# Patient Record
Sex: Male | Born: 1955 | Race: Black or African American | Hispanic: No | Marital: Married | Smoking: Never smoker
Health system: Southern US, Community
[De-identification: ages and names within clinical notes are randomized; demographics above are authoritative.]

---

## 1999-02-07 ENCOUNTER — Emergency Department (HOSPITAL_COMMUNITY): Admission: EM | Admit: 1999-02-07 | Discharge: 1999-02-07 | Payer: Self-pay | Admitting: Emergency Medicine

## 1999-02-07 ENCOUNTER — Encounter: Payer: Self-pay | Admitting: Emergency Medicine

## 2003-08-10 ENCOUNTER — Emergency Department (HOSPITAL_COMMUNITY): Admission: EM | Admit: 2003-08-10 | Discharge: 2003-08-11 | Payer: Self-pay | Admitting: Emergency Medicine

## 2011-05-09 ENCOUNTER — Ambulatory Visit (INDEPENDENT_AMBULATORY_CARE_PROVIDER_SITE_OTHER): Payer: 59 | Admitting: Emergency Medicine

## 2011-05-09 VITALS — BP 157/82 | HR 50 | Temp 98.0°F | Resp 16 | Ht 70.0 in | Wt 222.4 lb

## 2011-05-09 DIAGNOSIS — J069 Acute upper respiratory infection, unspecified: Secondary | ICD-10-CM

## 2011-05-09 DIAGNOSIS — R059 Cough, unspecified: Secondary | ICD-10-CM

## 2011-05-09 DIAGNOSIS — J209 Acute bronchitis, unspecified: Secondary | ICD-10-CM

## 2011-05-09 DIAGNOSIS — R05 Cough: Secondary | ICD-10-CM

## 2011-05-09 MED ORDER — FLUTICASONE PROPIONATE 50 MCG/ACT NA SUSP: 2.0000 | Freq: Every day | NASAL | Status: DC

## 2011-05-09 MED ORDER — AZITHROMYCIN 250 MG PO TABS: ORAL_TABLET | ORAL | Status: AC

## 2011-05-09 MED ORDER — BENZONATATE 200 MG PO CAPS: 200.0000 mg | ORAL_CAPSULE | Freq: Three times a day (TID) | ORAL | Status: AC | PRN

## 2011-05-10 NOTE — Progress Notes (Signed)
  Subjective:    Patient ID: Guy Huff, male    DOB: 09-30-1955, 56 y.o.   MRN: [id number]  HPI Patient enters with head congestion sore throat and a productive cough. He is a significant amount of nasal congestion and may have some allergies.    Review of Systems noncontributory except as relates to the present illness.     Objective:   Physical Exam HEENT exam reveals nasal congestion throat is slightly red TMs are clear chest exam reveals rhonchi but no rales no dullness clear breath sounds otherwise the        Assessment & Plan:  Assessment is probable upper respiratory infection with possible underlying allergic rhinitis with secondary bronchitis

## 2011-09-13 ENCOUNTER — Ambulatory Visit: Payer: 59

## 2011-09-13 ENCOUNTER — Ambulatory Visit (INDEPENDENT_AMBULATORY_CARE_PROVIDER_SITE_OTHER): Payer: 59 | Admitting: Internal Medicine

## 2011-09-13 VITALS — BP 128/78 | HR 56 | Temp 98.4°F | Resp 16 | Ht 69.0 in | Wt 225.0 lb

## 2011-09-13 DIAGNOSIS — S8990XA Unspecified injury of unspecified lower leg, initial encounter: Secondary | ICD-10-CM

## 2011-09-13 DIAGNOSIS — S99921A Unspecified injury of right foot, initial encounter: Secondary | ICD-10-CM

## 2011-09-13 DIAGNOSIS — IMO0001 Reserved for inherently not codable concepts without codable children: Secondary | ICD-10-CM

## 2011-09-13 DIAGNOSIS — S91309A Unspecified open wound, unspecified foot, initial encounter: Secondary | ICD-10-CM

## 2011-09-13 DIAGNOSIS — S99929A Unspecified injury of unspecified foot, initial encounter: Secondary | ICD-10-CM

## 2011-09-13 DIAGNOSIS — S91339A Puncture wound without foreign body, unspecified foot, initial encounter: Secondary | ICD-10-CM

## 2011-09-13 LAB — POCT CBC
Granulocyte percent: 47.1 %G (ref 37–80)
HCT, POC: 43.2 % — AB (ref 43.5–53.7)
Hemoglobin: 13.3 g/dL — AB (ref 14.1–18.1)
Lymph, poc: 2.7 (ref 0.6–3.4)
MCH, POC: 27.9 pg (ref 27–31.2)
MCHC: 30.8 g/dL — AB (ref 31.8–35.4)
MCV: 90.5 fL (ref 80–97)
MID (cbc): 0.6 (ref 0–0.9)
MPV: 9.1 fL (ref 0–99.8)
POC Granulocyte: 2.9 (ref 2–6.9)
POC LYMPH PERCENT: 43.7 %L (ref 10–50)
POC MID %: 9.2 %M (ref 0–12)
Platelet Count, POC: 209 10*3/uL (ref 142–424)
RBC: 4.77 M/uL (ref 4.69–6.13)
RDW, POC: 16.5 %
WBC: 6.2 10*3/uL (ref 4.6–10.2)

## 2011-09-13 MED ORDER — SULFAMETHOXAZOLE-TRIMETHOPRIM 800-160 MG PO TABS: 1.0000 | ORAL_TABLET | Freq: Two times a day (BID) | ORAL | Status: AC

## 2011-09-13 MED ORDER — TRAMADOL HCL 50 MG PO TABS: 50.0000 mg | ORAL_TABLET | Freq: Three times a day (TID) | ORAL | Status: AC | PRN

## 2011-09-13 NOTE — Progress Notes (Signed)
  Subjective:    Patient ID: Guy Huff, male    DOB: [birth year], 56 y.o.   MRN: [id number]  HPI Pain right foot Sole of foot  onset yesteday  stepped on a nail while working on tearing apart an old deck Nail rusty went through his old work boot  pain 5/10 increase with walking no drainage no radiation of the pain No diabetes does not know when his last tetanus shot was. Works as a IT sales professional  Review of Systems  Constitutional: Negative.   Eyes: Negative.   Respiratory: Negative.   Cardiovascular: Negative.   Gastrointestinal: Negative.   Genitourinary: Negative.   Musculoskeletal: Positive for gait problem.       Right foot pain  Skin: Positive for wound.       Planter surface of hte right foot  Neurological: Negative.   Hematological: Negative.   Psychiatric/Behavioral: Negative.   All other systems reviewed and are negative.       Objective:   Physical Exam  Constitutional: He is oriented to person, place, and time. He appears well-developed and well-nourished.  HENT:  Head: Normocephalic and atraumatic.  Right Ear: External ear normal.  Left Ear: External ear normal.  Nose: Nose normal.  Mouth/Throat: Oropharynx is clear and moist.  Eyes: Conjunctivae and EOM are normal. Pupils are equal, round, and reactive to light.  Neck: Normal range of motion. Neck supple.  Cardiovascular: Normal rate, regular rhythm and normal heart sounds.   Pulmonary/Chest: Effort normal and breath sounds normal.  Abdominal: Soft. Bowel sounds are normal.  Musculoskeletal: He exhibits tenderness.       Planter s urface of the right foot  Neurological: He is alert and oriented to person, place, and time. He has normal reflexes.  Skin: There is erythema.       plantrer surface of hte right foot  Psychiatric: He has a normal mood and affect. His behavior is normal. Judgment and thought content normal.     Results for orders placed in visit on 09/13/11  POCT CBC      Component Value  Range   WBC 6.2  4.6 - 10.2 K/uL   Lymph, poc 2.7  0.6 - 3.4   POC LYMPH PERCENT 43.7  10 - 50 %L   MID (cbc) 0.6  0 - 0.9   POC MID % 9.2  0 - 12 %M   POC Granulocyte 2.9  2 - 6.9   Granulocyte percent 47.1  37 - 80 %G   RBC 4.77  4.69 - 6.13 M/uL   Hemoglobin 13.3 (*) 14.1 - 18.1 g/dL   HCT, POC 62.8 (*) 31.5 - 53.7 %   MCV 90.5  80 - 97 fL   MCH, POC 27.9  27 - 31.2 pg   MCHC 30.8 (*) 31.8 - 35.4 g/dL   RDW, POC 17.6     Platelet Count, POC 209  142 - 424 K/uL   MPV 9.1  0 - 99.8 fL   Wbc normal    Assessment & Plan:  cellulitis right foot puncture wound right foot tetanus immunization out of date Wound injected with 2 percent lidocaine cleansed and debridement of puncture wound done under sterile conditions.

## 2011-09-13 NOTE — Patient Instructions (Addendum)
Take meds as directed. Keep wound clean and dry. Keep foot elevated. Clean white sock change 2 times daily keep area opened to hte air at bedtime. If worsening return to the office or er.

## 2011-09-13 NOTE — Progress Notes (Signed)
  Subjective:    Patient ID: Guy Huff, male    DOB: 1955-02-26, 56 y.o.   MRN: [id number]  HPI    Review of Systems     Objective:   Physical Exam Normal l cbc   Results for orders placed in visit on 09/13/11  POCT CBC      Component Value Range   WBC 6.2  4.6 - 10.2 K/uL   Lymph, poc 2.7  0.6 - 3.4   POC LYMPH PERCENT 43.7  10 - 50 %L   MID (cbc) 0.6  0 - 0.9   POC MID % 9.2  0 - 12 %M   POC Granulocyte 2.9  2 - 6.9   Granulocyte percent 47.1  37 - 80 %G   RBC 4.77  4.69 - 6.13 M/uL   Hemoglobin 13.3 (*) 14.1 - 18.1 g/dL   HCT, POC 40.9 (*) 81.1 - 53.7 %   MCV 90.5  80 - 97 fL   MCH, POC 27.9  27 - 31.2 pg   MCHC 30.8 (*) 31.8 - 35.4 g/dL   RDW, POC 91.4     Platelet Count, POC 209  142 - 424 K/uL   MPV 9.1  0 - 99.8 fL     Assessment & Plan:

## 2011-09-13 NOTE — Progress Notes (Signed)
  Subjective:    Patient ID: Guy Huff, male    DOB: 10/27/1955, 56 y.o.   MRN: [id number]  HPI    Review of Systems     Objective:   Physical Exam       [facility] reading (PRIMARY) by  Dr. Mindi Junker negitive for foreign body.  Assessment & Plan:

## 2011-10-18 ENCOUNTER — Ambulatory Visit (INDEPENDENT_AMBULATORY_CARE_PROVIDER_SITE_OTHER): Payer: 59 | Admitting: Family Medicine

## 2011-10-18 VITALS — BP 155/81 | HR 55 | Temp 98.5°F | Resp 16 | Ht 69.0 in | Wt 227.6 lb

## 2011-10-18 DIAGNOSIS — S39012A Strain of muscle, fascia and tendon of lower back, initial encounter: Secondary | ICD-10-CM

## 2011-10-18 DIAGNOSIS — IMO0002 Reserved for concepts with insufficient information to code with codable children: Secondary | ICD-10-CM

## 2011-10-18 DIAGNOSIS — M5430 Sciatica, unspecified side: Secondary | ICD-10-CM

## 2011-10-18 MED ORDER — CYCLOBENZAPRINE HCL 10 MG PO TABS: 10.0000 mg | ORAL_TABLET | Freq: Two times a day (BID) | ORAL | Status: AC | PRN

## 2011-10-18 MED ORDER — PREDNISONE 20 MG PO TABS: ORAL_TABLET | ORAL | Status: AC

## 2011-10-18 NOTE — Progress Notes (Signed)
[facility] [address] [place] [address] [phone]  Date:  10/18/2011   Name:  Hayse Glenney   DOB:  [birth year]   MRN:  [id number]  PCP:  No primary provider on file.    Chief Complaint: Back Pain   History of Present Illness:  Guy Huff is a 56 y.o. very pleasant male patient who presents with the following:  Here today with lower back pain- he has pain in his right hip and right sided muscles.  He has noted symptoms for about one week- the pain started while he was walking.  He sometimes has radiation to the outside of his right knee and leg.  No sensory or strength issues, no bowel or bladder dysfunction.    He is usually healthy, never had this in the past.   He tried advil and aleve- this did help some.   His symptoms are the worst while walking.   Declined a flu shot today.    Never a smoker.    There is no problem list on file for this patient.   No past medical history on file.  No past surgical history on file.  History  Substance Use Topics  . Smoking status: Never Smoker   . Smokeless tobacco: Not on file  . Alcohol Use: Not on file    No family history on file.  No Known Allergies  Medication list has been reviewed and updated.  Current Outpatient Prescriptions on File Prior to Visit  Medication Sig Dispense Refill  . fluticasone (FLONASE) 50 MCG/ACT nasal spray Place 2 sprays into the nose daily.  16 g  6    Review of Systems:  As per HPI- otherwise negative.   Physical Examination: Filed Vitals:   10/18/11 1347  BP: 155/81  Pulse: 55  Temp: 98.5 F (36.9 C)  Resp: 16   Filed Vitals:   10/18/11 1347  Height: 5\' 9"  (1.753 m)  Weight: 227 lb 9.6 oz (103.239 kg)   Body mass index is 33.61 kg/(m^2). Ideal Body Weight: Weight in (lb) to have BMI = 25: 168.9   GEN: WDWN, NAD, Non-toxic, A & O x 3, overweight HEENT: Atraumatic, Normocephalic. Neck supple. No masses, No LAD.  TM and oropharynx wnl Ears and  Nose: No external deformity. CV: RRR, No M/G/R. No JVD. No thrill. No extra heart sounds. PULM: CTA B, no wheezes, crackles, rhonchi. No retractions. No resp. distress. No accessory muscle use. Back: mild tenderness over the right sciatic notch.  Stiffness with back flexion, normal extension and rotation.  Negative straight leg raise, normal sensation, strength and DTR both legs.   EXTR: No c/c/e NEURO Normal gait.  PSYCH: Normally interactive. Conversant. Not depressed or anxious appearing.  Calm demeanor.    Assessment and Plan: 1. Back strain    2. Sciatica  predniSONE (DELTASONE) 20 MG tablet, cyclobenzaprine (FLEXERIL) 10 MG tablet   Sciatica- treat as above, and he may continue NSAIDs and use a heating pad.  Let me know if not better in the next few days- Sooner if worse.     Abbe Amsterdam, MD

## 2014-01-26 ENCOUNTER — Ambulatory Visit (INDEPENDENT_AMBULATORY_CARE_PROVIDER_SITE_OTHER): Payer: 59 | Admitting: Family Medicine

## 2014-01-26 ENCOUNTER — Ambulatory Visit (INDEPENDENT_AMBULATORY_CARE_PROVIDER_SITE_OTHER): Payer: 59

## 2014-01-26 VITALS — BP 146/90 | HR 64 | Temp 98.7°F | Resp 18 | Ht 68.5 in | Wt 227.6 lb

## 2014-01-26 DIAGNOSIS — R0781 Pleurodynia: Secondary | ICD-10-CM

## 2014-01-26 NOTE — Progress Notes (Signed)
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 He played golf a few days ago and wondered whether he strained his chest wall. But actually the pain didn't start for couple of days. Over the last couple of days he been having pain in the left pectoralis area below the left axilla. Knows of no other specific injury though he's been having a lot of calls. Yesterday he had the EMTs check an EKG on him in the 12-lead look normal. He's not been having pain into his arm. No shortness of breath. No substernal pain. No nausea or vomiting. Has not been having these pains previously. He does not smoke. No history of high blood pressure. No known trauma.  Objective: Pleasant healthy-appearing man in no major distress. Upper motor strength is good. He has full range of motion of the left arm. He is not tender in the shoulder scapular areas. The chest is clear. Heart regular without murmurs. He is tender in the left lateral chest wall, just lateral to and below his left breast.  Assessment: Chest wall pain  Plan: EKG Left rib series  [facility] reading (PRIMARY) by  Dr. Alwyn RenHopper Normal rib series  Intercostal muscle strain.  See how he does on Aleve, return if further problems.

## 2014-01-26 NOTE — Patient Instructions (Signed)
Take Aleve 2 pills twice daily for 5 or 6 days.  Return at any time if worse  If chest pain gets worse or changes in nature make sure you return for further check out.

## 2014-10-13 ENCOUNTER — Other Ambulatory Visit: Payer: Self-pay | Admitting: Occupational Medicine

## 2014-10-13 ENCOUNTER — Ambulatory Visit
Admission: RE | Admit: 2014-10-13 | Discharge: 2014-10-13 | Disposition: A | Discharge status: Home / Self Care | Payer: No Typology Code available for payment source | Source: Ambulatory Visit | Attending: Occupational Medicine | Admitting: Occupational Medicine

## 2014-10-13 DIAGNOSIS — Z0000 Encounter for general adult medical examination without abnormal findings: Secondary | ICD-10-CM

## 2016-01-20 IMAGING — CR DG CHEST 2V
2 series · 2 of 2 positions shown · non-contrast
Comparison: Chest x-ray of March 29, 2013

CLINICAL DATA: Exit exam from [HOSPITAL] fire department

EXAM:
CHEST  2 VIEW

[w chest pa]
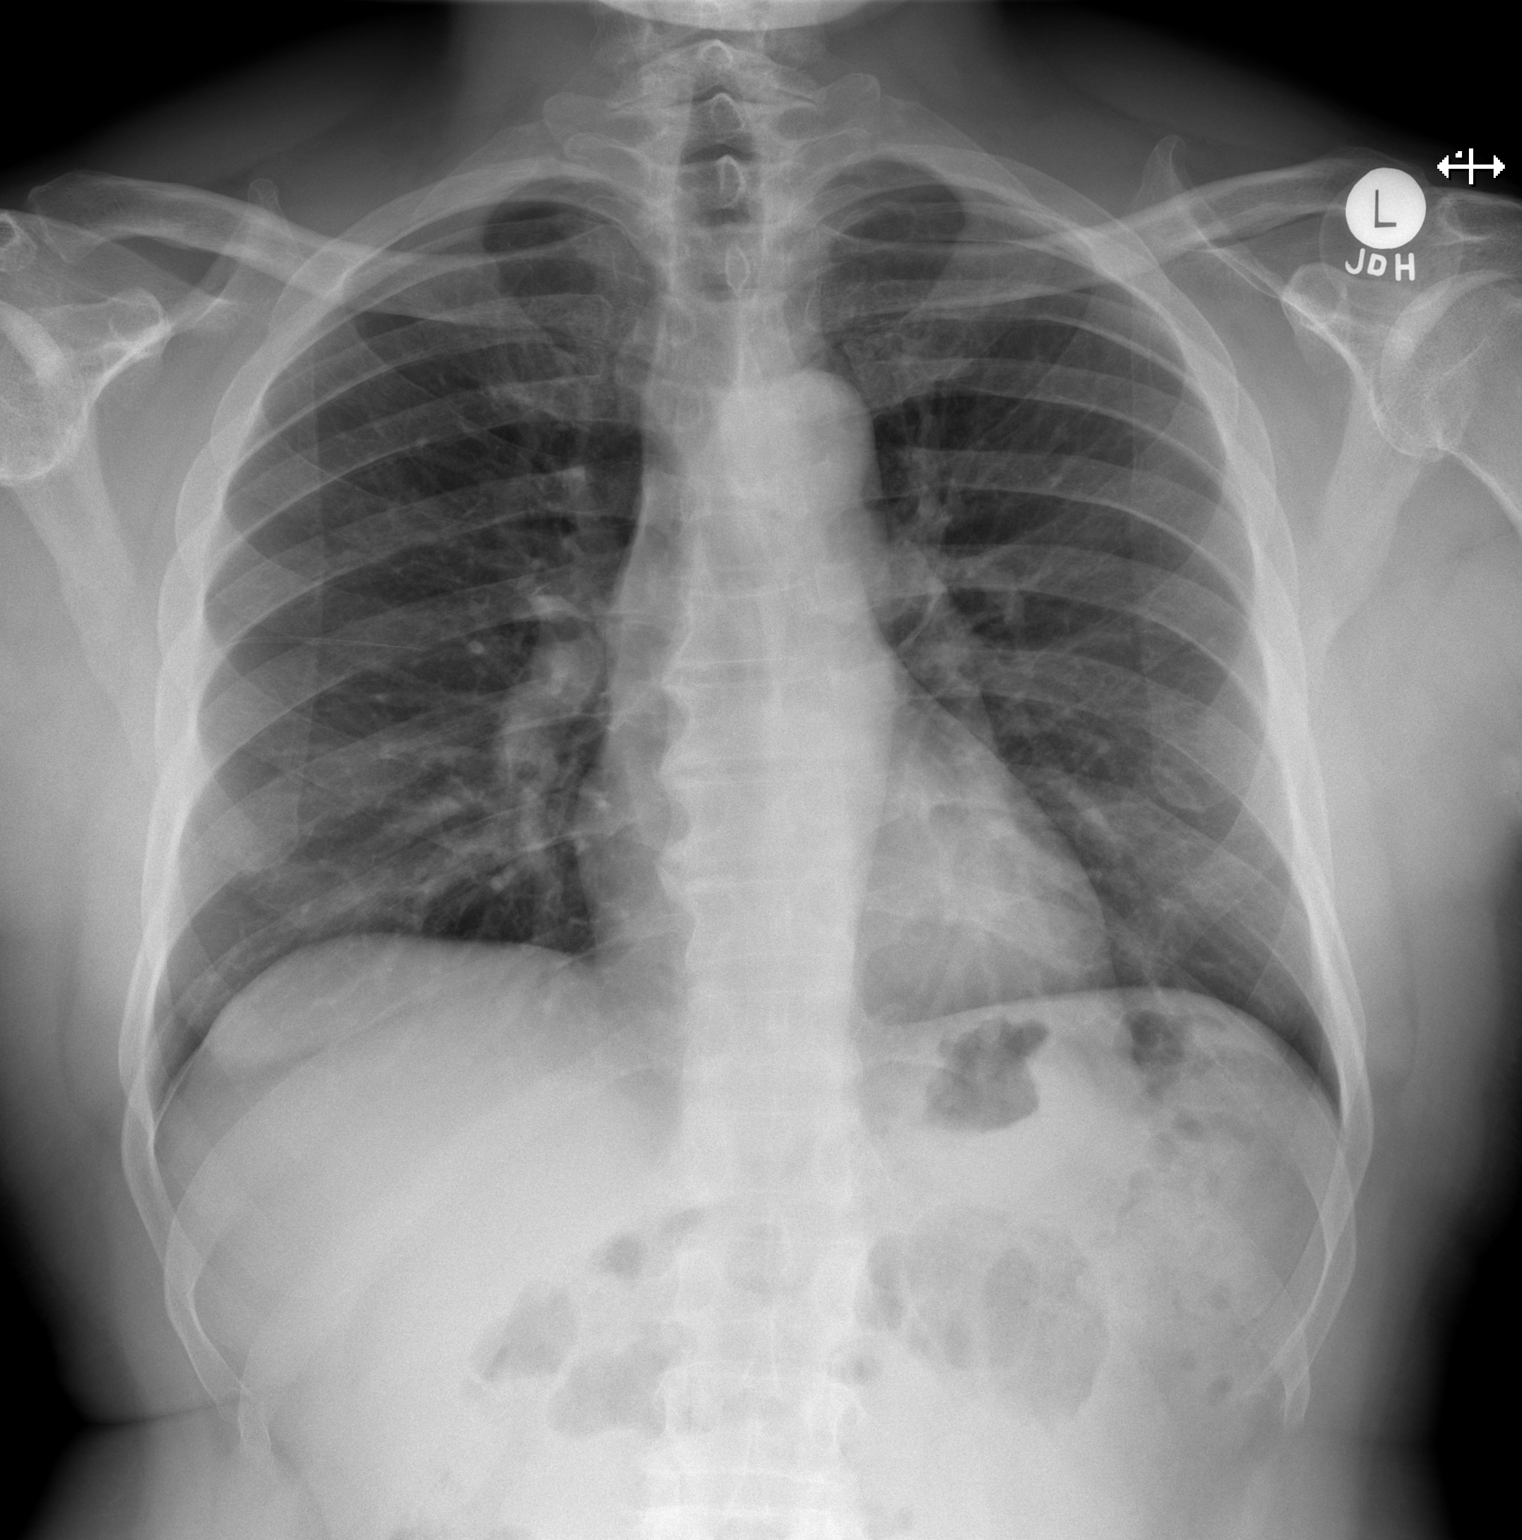

[w chest lat]
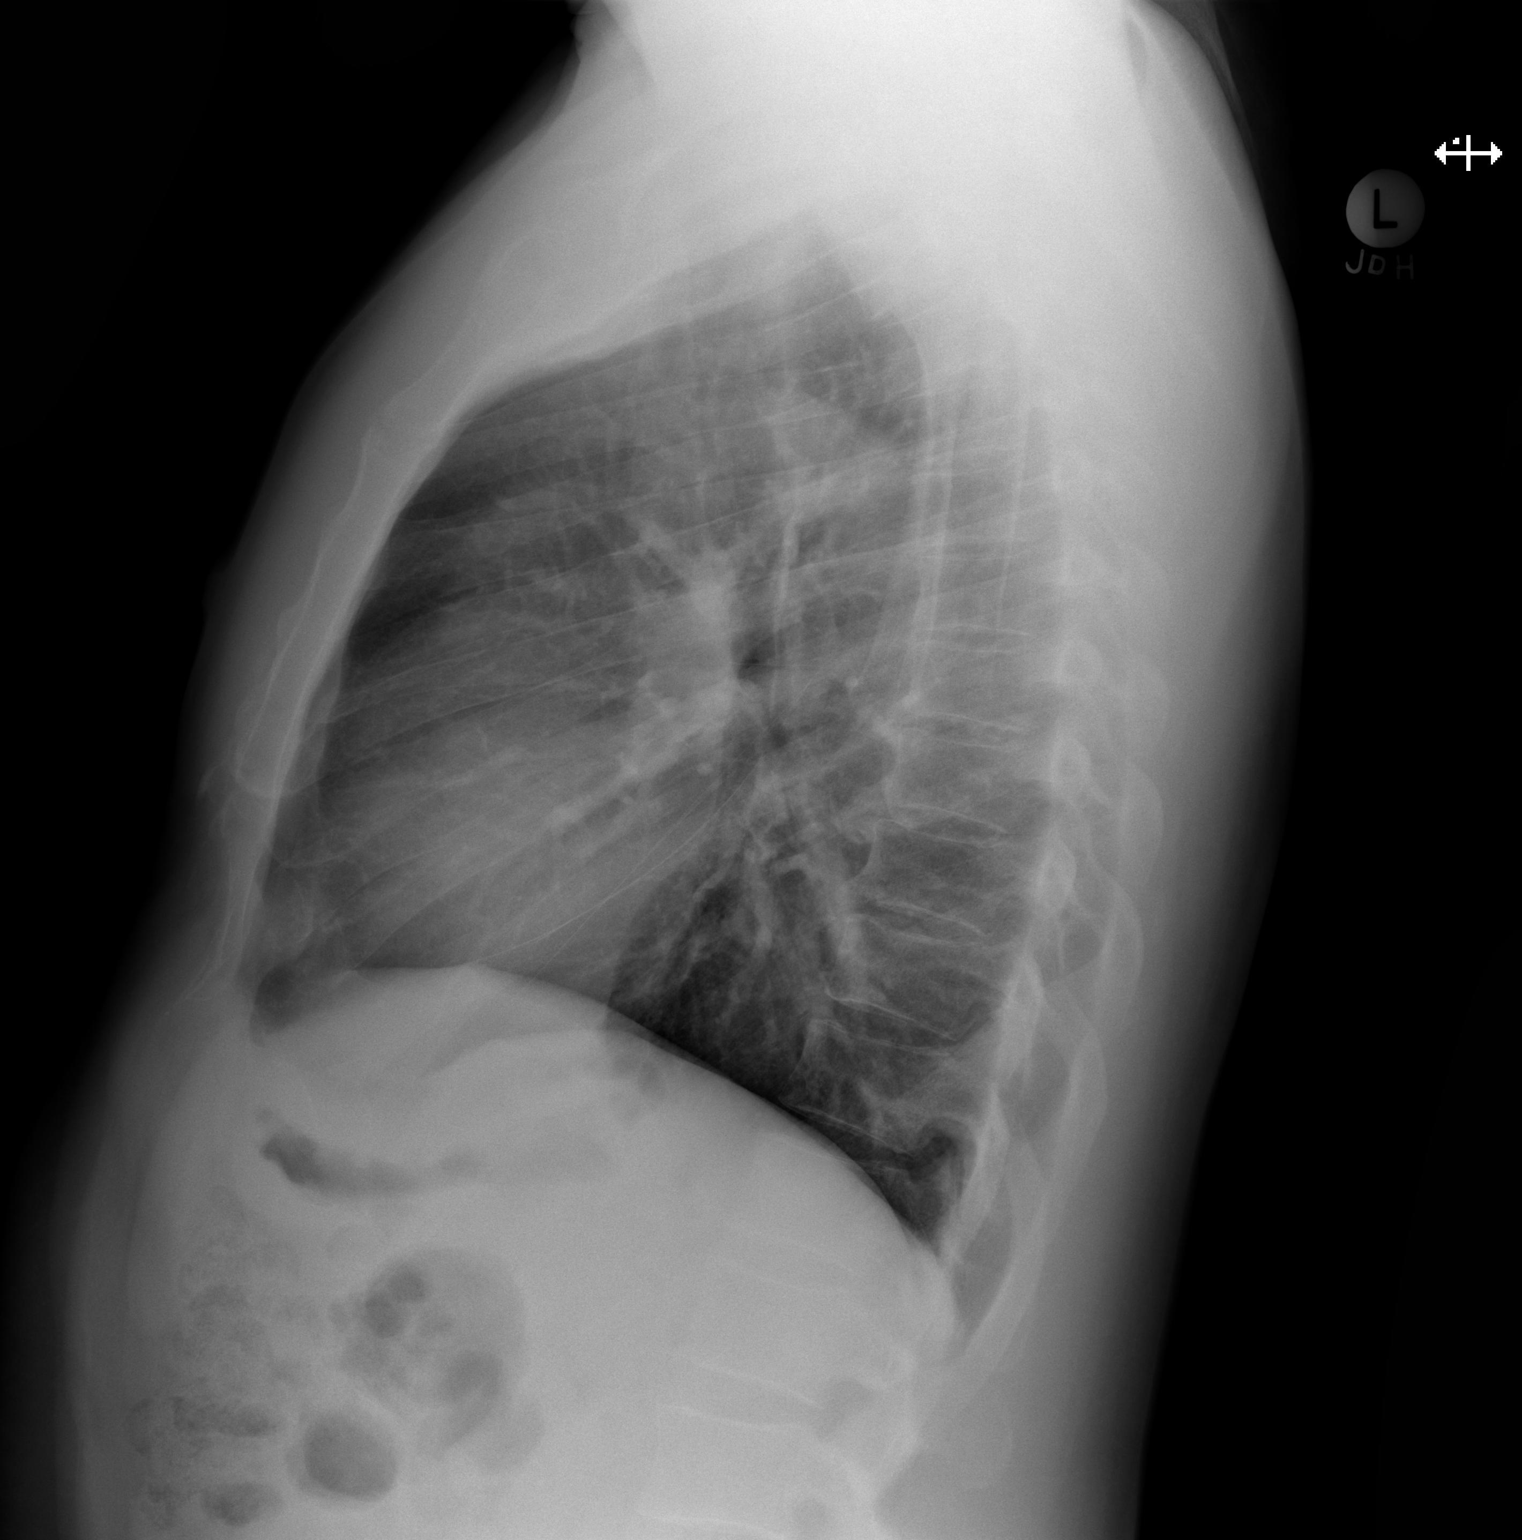

[2 of 2 positions shown; findings below may reference images not displayed]

FINDINGS: The lungs are adequately inflated. There is no focal infiltrate.
There is no pleural effusion. The heart and pulmonary vascularity
are normal. The mediastinum is normal in width. The bony thorax is
unremarkable.
IMPRESSION: There is no active cardiopulmonary disease.

## 2016-02-08 DIAGNOSIS — H04123 Dry eye syndrome of bilateral lacrimal glands: Secondary | ICD-10-CM | POA: Diagnosis not present

## 2016-03-14 DIAGNOSIS — L02419 Cutaneous abscess of limb, unspecified: Secondary | ICD-10-CM | POA: Diagnosis not present

## 2016-03-14 DIAGNOSIS — L03119 Cellulitis of unspecified part of limb: Secondary | ICD-10-CM | POA: Diagnosis not present

## 2016-03-19 DIAGNOSIS — L0291 Cutaneous abscess, unspecified: Secondary | ICD-10-CM | POA: Diagnosis not present

## 2016-03-19 DIAGNOSIS — K529 Noninfective gastroenteritis and colitis, unspecified: Secondary | ICD-10-CM | POA: Diagnosis not present

## 2016-03-19 DIAGNOSIS — L0292 Furuncle, unspecified: Secondary | ICD-10-CM | POA: Diagnosis not present

## 2016-03-20 DIAGNOSIS — B9689 Other specified bacterial agents as the cause of diseases classified elsewhere: Secondary | ICD-10-CM | POA: Diagnosis not present

## 2016-03-20 DIAGNOSIS — L02425 Furuncle of right lower limb: Secondary | ICD-10-CM | POA: Diagnosis not present

## 2016-03-27 DIAGNOSIS — Z0000 Encounter for general adult medical examination without abnormal findings: Secondary | ICD-10-CM | POA: Diagnosis not present

## 2016-03-27 DIAGNOSIS — Z23 Encounter for immunization: Secondary | ICD-10-CM | POA: Diagnosis not present

## 2016-03-27 DIAGNOSIS — Z1159 Encounter for screening for other viral diseases: Secondary | ICD-10-CM | POA: Diagnosis not present

## 2016-04-27 ENCOUNTER — Other Ambulatory Visit: Payer: Self-pay | Admitting: Gastroenterology

## 2016-07-04 ENCOUNTER — Encounter (HOSPITAL_COMMUNITY): Payer: Self-pay | Admitting: *Deleted

## 2016-07-09 ENCOUNTER — Ambulatory Visit (HOSPITAL_COMMUNITY)
Admission: RE | Admit: 2016-07-09 | Payer: Commercial Managed Care - HMO | Source: Ambulatory Visit | Admitting: Gastroenterology

## 2016-07-09 ENCOUNTER — Encounter (HOSPITAL_COMMUNITY): Admission: RE | Payer: Self-pay | Source: Ambulatory Visit

## 2016-07-09 SURGERY — COLONOSCOPY WITH PROPOFOL
Anesthesia: Monitor Anesthesia Care

## 2017-02-24 DIAGNOSIS — J019 Acute sinusitis, unspecified: Secondary | ICD-10-CM | POA: Diagnosis not present

## 2017-02-24 DIAGNOSIS — R05 Cough: Secondary | ICD-10-CM | POA: Diagnosis not present

## 2017-03-14 DIAGNOSIS — H40033 Anatomical narrow angle, bilateral: Secondary | ICD-10-CM | POA: Diagnosis not present

## 2017-03-14 DIAGNOSIS — H04123 Dry eye syndrome of bilateral lacrimal glands: Secondary | ICD-10-CM | POA: Diagnosis not present

## 2017-03-19 DIAGNOSIS — R05 Cough: Secondary | ICD-10-CM | POA: Diagnosis not present

## 2017-03-28 DIAGNOSIS — Z0000 Encounter for general adult medical examination without abnormal findings: Secondary | ICD-10-CM | POA: Diagnosis not present

## 2017-03-28 DIAGNOSIS — Z23 Encounter for immunization: Secondary | ICD-10-CM | POA: Diagnosis not present

## 2017-03-28 DIAGNOSIS — Z136 Encounter for screening for cardiovascular disorders: Secondary | ICD-10-CM | POA: Diagnosis not present

## 2018-01-22 ENCOUNTER — Other Ambulatory Visit: Payer: Self-pay | Admitting: Family Medicine
